# Patient Record
Sex: Female | Born: 1980 | Race: White | Hispanic: No | Marital: Single | Smoking: Current every day smoker
Health system: Southern US, Community
[De-identification: ages and names within clinical notes are randomized; demographics above are authoritative.]

## PROBLEM LIST (undated history)

## (undated) DIAGNOSIS — Q780 Osteogenesis imperfecta: Secondary | ICD-10-CM

---

## 2016-06-13 ENCOUNTER — Emergency Department (HOSPITAL_COMMUNITY)
Admission: EM | Admit: 2016-06-13 | Discharge: 2016-06-14 | Disposition: A | Discharge status: Home / Self Care | Payer: Medicaid - Out of State | Attending: Emergency Medicine | Admitting: Emergency Medicine

## 2016-06-13 ENCOUNTER — Encounter (HOSPITAL_COMMUNITY): Payer: Self-pay | Admitting: *Deleted

## 2016-06-13 DIAGNOSIS — S6991XA Unspecified injury of right wrist, hand and finger(s), initial encounter: Secondary | ICD-10-CM

## 2016-06-13 DIAGNOSIS — F17200 Nicotine dependence, unspecified, uncomplicated: Secondary | ICD-10-CM | POA: Diagnosis not present

## 2016-06-13 DIAGNOSIS — Z79899 Other long term (current) drug therapy: Secondary | ICD-10-CM | POA: Diagnosis not present

## 2016-06-13 DIAGNOSIS — S60211A Contusion of right wrist, initial encounter: Secondary | ICD-10-CM | POA: Diagnosis not present

## 2016-06-13 DIAGNOSIS — Y999 Unspecified external cause status: Secondary | ICD-10-CM | POA: Diagnosis not present

## 2016-06-13 DIAGNOSIS — M546 Pain in thoracic spine: Secondary | ICD-10-CM | POA: Diagnosis not present

## 2016-06-13 DIAGNOSIS — Y9289 Other specified places as the place of occurrence of the external cause: Secondary | ICD-10-CM | POA: Diagnosis not present

## 2016-06-13 DIAGNOSIS — W010XXA Fall on same level from slipping, tripping and stumbling without subsequent striking against object, initial encounter: Secondary | ICD-10-CM | POA: Diagnosis not present

## 2016-06-13 DIAGNOSIS — Y939 Activity, unspecified: Secondary | ICD-10-CM | POA: Diagnosis not present

## 2016-06-13 HISTORY — DX: Osteogenesis imperfecta: Q78.0

## 2016-06-13 MED ORDER — OXYCODONE-ACETAMINOPHEN 5-325 MG PO TABS
1.0000 | ORAL_TABLET | Freq: Once | ORAL | Status: AC
  Administered 2016-06-13: 1 via ORAL
  Filled 2016-06-13: qty 1

## 2016-06-13 NOTE — ED Notes (Signed)
[place] injured her rt wrist.  lmp just finished

## 2016-06-13 NOTE — ED Provider Notes (Signed)
CSN: [id number]     Arrival date & time 06/13/16  2314 History  By signing my name below, I, Christina Mahoney, attest that this documentation has been prepared under the direction and in the presence of Levorn Oleski, PA-C Electronically Signed: Soijett Mahoney, ED Scribe. 06/13/2016. 11:40 PM.    Chief Complaint  Patient presents with  . Wrist Injury      The history is provided by the patient. No language interpreter was used.    Christina ChurchesChristy Mahoney is a 35 y.o. female who presents to the Emergency Department complaining of right wrist injury occurring 2-3 hours ago. [place] she slipped on a wet rock, landing on her right wrist. Pt is having associated symptoms of bruising to right wrist, right wrist pain, and back pain. She notes that she has tried ice without medications for the relief of her symptoms. She denies wound, neuro deficits, right elbow pain, right shoulder pain, neck pain, head injury, LOC, or any other symptoms. Pt is allergic to morphine and naprosyn and her allergy is a rash with both medications. The incident with morphine was over 10 years ago.   Past Medical History  Diagnosis Date  . Osteogenesis imperfecta    History reviewed. No pertinent past surgical history. No family history on file. Social History  Substance Use Topics  . Smoking status: Current Every Day Smoker  . Smokeless tobacco: None  . Alcohol Use: No   OB History    No data available     Review of Systems  Musculoskeletal: Positive for back pain and arthralgias (right wrist). Negative for joint swelling.  Skin: Positive for color change (bruising to right wrist). Negative for wound.  Neurological: Negative for dizziness, light-headedness, numbness and headaches.  All other systems reviewed and are negative.     Allergies  Morphine and related and Naproxen  Home Medications   Prior to Admission medications   Medication Sig Start Date End Date Taking?  Authorizing Provider  lidocaine (LIDODERM) 5 % Place 1 patch onto the skin daily. Remove & Discard patch within 12 hours or as directed by MD 06/14/16   Anselm PancoastShawn C [place] Ganoe, PA-C  methocarbamol (ROBAXIN) 500 MG tablet Take 1 tablet (500 mg total) by mouth 2 (two) times daily. 06/14/16   Loveah Like C [place] Hollar, PA-C  oxyCODONE-acetaminophen (PERCOCET/ROXICET) 5-325 MG tablet Take 1 tablet by mouth every 6 (six) hours as needed for severe pain. 06/14/16   Ronnell Clinger C Philipe Laswell, PA-C   BP 119/82 mmHg  Pulse 109  Temp(Src) 98.3 F (36.8 C) (Oral)  Resp 16  Ht 5\' 2"  (1.575 m)  Wt 115 lb (52.164 kg)  BMI 21.03 kg/m2  SpO2 100% Physical Exam  Constitutional: She is oriented to person, place, and time. She appears well-developed and well-nourished. No distress.  HENT:  Head: Normocephalic and atraumatic.  Mouth/Throat: Oropharynx is clear and moist.  Eyes: Conjunctivae and EOM are normal. Pupils are equal, round, and reactive to light.  Neck: Normal range of motion. Neck supple.  Cardiovascular: Normal rate, regular rhythm and intact distal pulses.   Pulmonary/Chest: Effort normal and breath sounds normal. No respiratory distress. She exhibits no tenderness.  Abdominal: Soft. She exhibits no distension. There is no tenderness. There is no guarding.  Musculoskeletal: She exhibits edema and tenderness.  Bruising to the ulnar side of right wrist along with tenderness and swelling. Midline thoracic and lumbar spinal tenderness. No crepitus, deformity, or step-off.    Lymphadenopathy:  She has no cervical adenopathy.  Neurological: She is alert and oriented to person, place, and time. She has normal reflexes.  No sensory deficits. Strength 5/5 in all extremities, 5/5 grip strength bilaterally, but weaker on right due to pain. No gait disturbance. Coordination intact. Cranial nerves III-XII grossly intact.   Skin: Skin is warm and dry. She is not diaphoretic.  Psychiatric: She has a normal mood and affect. Her behavior is  normal.  Nursing note and vitals reviewed.   ED Course  Procedures (including critical care time) DIAGNOSTIC STUDIES: Oxygen Saturation is 100% on RA, nl by my interpretation.    COORDINATION OF CARE: 11:36 PM Discussed treatment plan with pt at bedside which includes right wrist xray, right hand xray, lumbar spine xray, thoracic spine xray, UA, and percocet, and pt agreed to plan.  Labs Review Labs Reviewed  POC URINE PREG, ED    Imaging Review Dg Thoracic Spine 2 View  06/14/2016  CLINICAL DATA:  Status post fall, with upper back pain. Initial encounter. EXAM: THORACIC SPINE 2 VIEWS COMPARISON:  None. FINDINGS: There is no evidence of fracture or subluxation. Vertebral bodies demonstrate normal height and alignment. Intervertebral disc spaces are preserved. The visualized portions of both lungs are clear. The mediastinum is unremarkable in appearance. IMPRESSION: No evidence of fracture or subluxation along the thoracic spine. Electronically Signed   By: Roanna Raider M.D.   On: 06/14/2016 01:10   Dg Lumbar Spine Complete  06/14/2016  CLINICAL DATA:  Status post fall, with lower back pain. Initial encounter. EXAM: LUMBAR SPINE - COMPLETE 4+ VIEW COMPARISON:  None. FINDINGS: There is no evidence of fracture or subluxation. Vertebral bodies demonstrate normal height and alignment. Intervertebral disc spaces are preserved. The visualized neural foramina are grossly unremarkable in appearance. The visualized bowel gas pattern is unremarkable in appearance; air and stool are noted within the colon. The sacroiliac joints are within normal limits. IMPRESSION: No evidence of fracture or subluxation along the lumbar spine. Electronically Signed   By: Roanna Raider M.D.   On: 06/14/2016 01:09   Dg Wrist Complete Right  06/14/2016  CLINICAL DATA:  Status post fall, with right wrist and hand pain and bruising. Initial encounter. EXAM: RIGHT WRIST - COMPLETE 3+ VIEW COMPARISON:  None. FINDINGS: There  is no evidence of fracture or dislocation. The carpal rows are intact, and demonstrate normal alignment. The joint spaces are preserved. No significant soft tissue abnormalities are seen. IMPRESSION: No evidence of fracture or dislocation. Electronically Signed   By: Roanna Raider M.D.   On: 06/14/2016 01:08   Dg Hand Complete Right  06/14/2016  CLINICAL DATA:  Status post fall, with right wrist and hand pain and bruising. Initial encounter. EXAM: RIGHT HAND - COMPLETE 3+ VIEW COMPARISON:  None. FINDINGS: There is no evidence of fracture or dislocation. The joint spaces are preserved. The carpal rows are intact, and demonstrate normal alignment. The soft tissues are unremarkable in appearance. IMPRESSION: No evidence of fracture or dislocation. Electronically Signed   By: Roanna Raider M.D.   On: 06/14/2016 01:09   I have personally reviewed and evaluated these images and lab results as part of my medical decision-making.  Orders Placed This Encounter  Procedures  . DG Wrist Complete Right  . DG Hand Complete Right  . DG Lumbar Spine Complete  . DG Thoracic Spine 2 View  . Re-check Vital Signs  . Splint wrist  . POC Urine Pregnancy, ED (do NOT order at [facility])  Medications  oxyCODONE-acetaminophen (PERCOCET/ROXICET) 5-325 MG per tablet 1 tablet (1 tablet Oral Given 06/13/16 2358)    MDM   Final diagnoses:  Wrist injury, right, initial encounter  Midline thoracic back pain    Neysa BonitoChristy Lapidus presents with right wrist and back pain following a fall earlier today.  Level 5 chart initiated due to the possibility of patient's wrist pain as a distracting injury combined with the mechanism. Patient also at higher risk for fracture due to osteogenesis imperfecta. Patient with no neuro or functional deficits. No red flag symptoms. No evidence of cauda equina or other serious issue. X-rays show no abnormalities. Patient to follow-up with orthopedics should symptoms fail to resolve. Home care and  return precautions discussed. Patient voiced understanding of these instructions and is comfortable with discharge.  Note: Although the patient's pulse was documented as elevated, upon my assessment patient's pulse was normal and remained so throughout her encounter in the ED. Suspect initial elevated pulse to be due to pain.  Filed Vitals:   06/13/16 2323  BP: 119/82  Pulse: 109  Temp: 98.3 F (36.8 C)  TempSrc: Oral  Resp: 16  Height: 5\' 2"  (1.575 m)  Weight: 52.164 kg  SpO2: 100%     I personally performed the services described in this documentation, which was scribed in my presence. The recorded information has been reviewed and is accurate.   Anselm PancoastShawn C Khole Arterburn, PA-C 06/14/16 0142  Dione Boozeavid Glick, MD 06/14/16 0700

## 2016-06-14 ENCOUNTER — Emergency Department (HOSPITAL_COMMUNITY): Payer: Medicaid - Out of State

## 2016-06-14 LAB — POC URINE PREG, ED: Preg Test, Ur: NEGATIVE

## 2016-06-14 MED ORDER — LIDOCAINE 5 % EX PTCH: 1.0000 | MEDICATED_PATCH | CUTANEOUS | Status: AC

## 2016-06-14 MED ORDER — METHOCARBAMOL 500 MG PO TABS: 500.0000 mg | ORAL_TABLET | Freq: Two times a day (BID) | ORAL | Status: AC

## 2016-06-14 MED ORDER — OXYCODONE-ACETAMINOPHEN 5-325 MG PO TABS: 1.0000 | ORAL_TABLET | Freq: Four times a day (QID) | ORAL | Status: AC | PRN

## 2016-06-14 NOTE — Discharge Instructions (Signed)
You have been seen today for evaluation following a fall. Your imaging showed no abnormalities. Expect your soreness to increase over the next 2-3 days. Take it easy, but do not lay around too much as this may make the stiffness worse. May take plain Tylenol for pain. Robaxin is a muscle relaxer and may help loosen stiff muscles. Percocet for severe pain. Do not take the Robaxin or Percocet while driving or performing other dangerous activities. Follow-up with orthopedics should symptoms continue.

## 2016-11-13 IMAGING — CR DG LUMBAR SPINE COMPLETE 4+V
5 series · 5 of 5 positions shown · non-contrast
Comparison: None.

CLINICAL DATA: Status post fall, with lower back pain. Initial
encounter.

EXAM:
LUMBAR SPINE - COMPLETE 4+ VIEW

[l-spine ap]
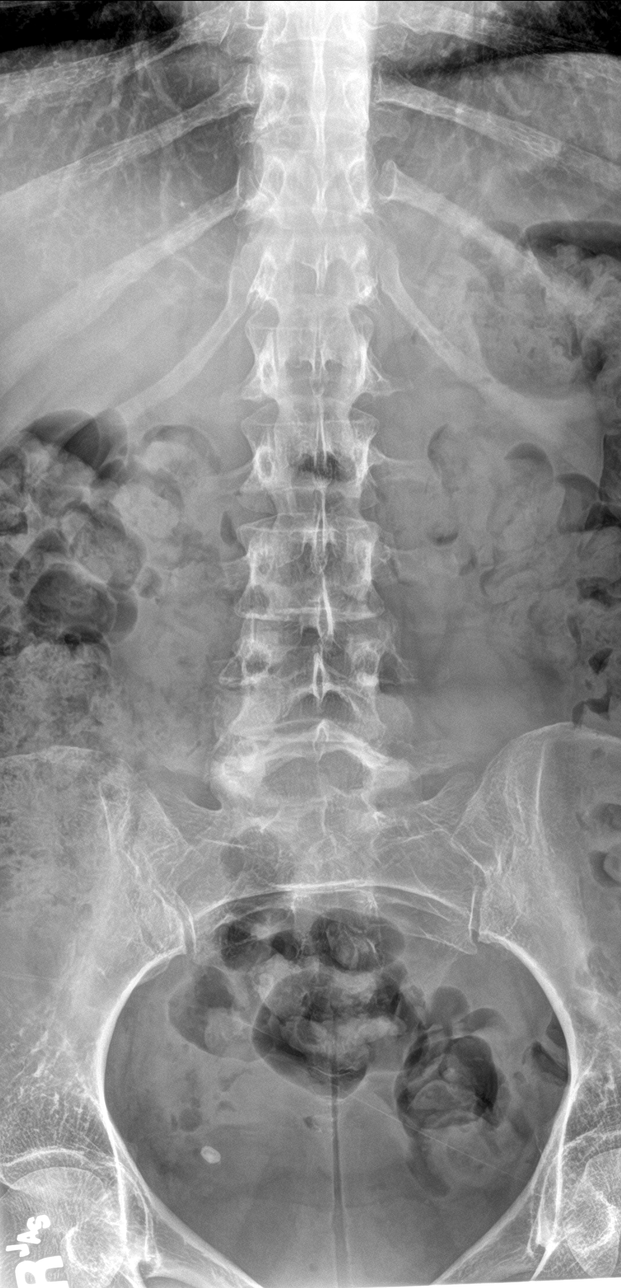

[l-spine obl (1 of 2)]
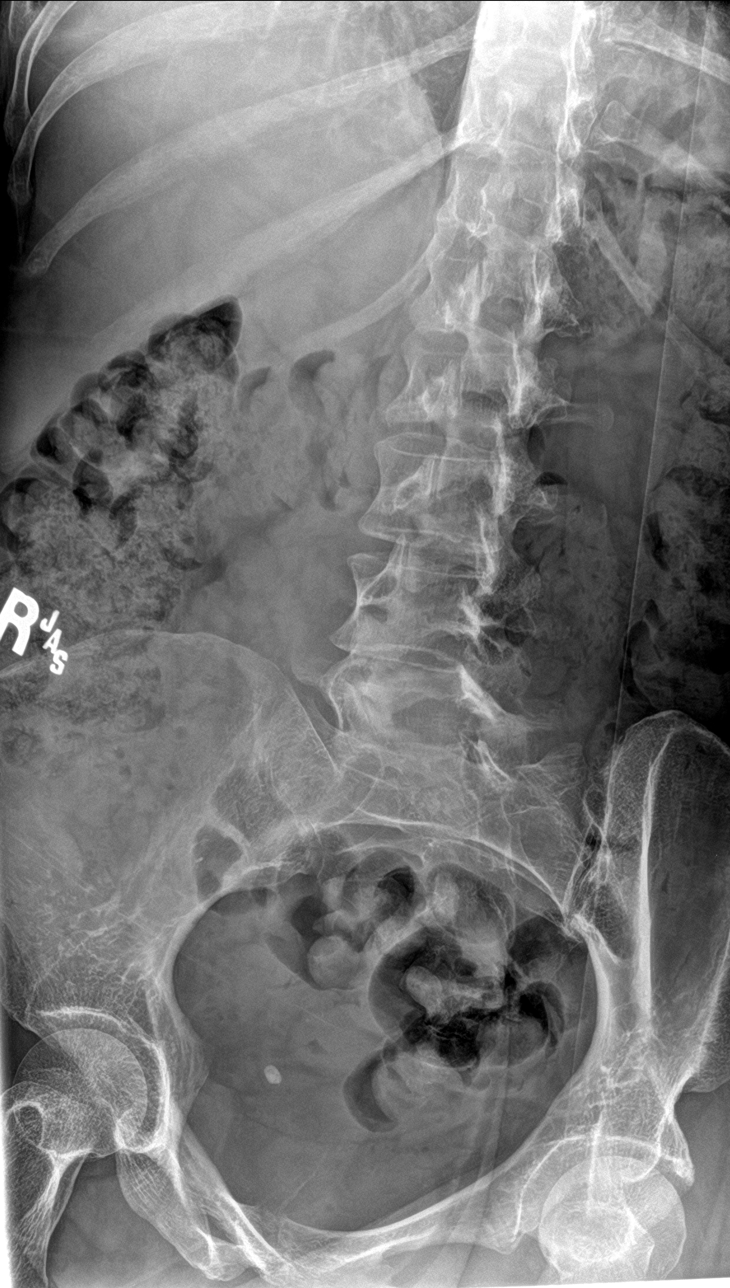

[l-spine lat]
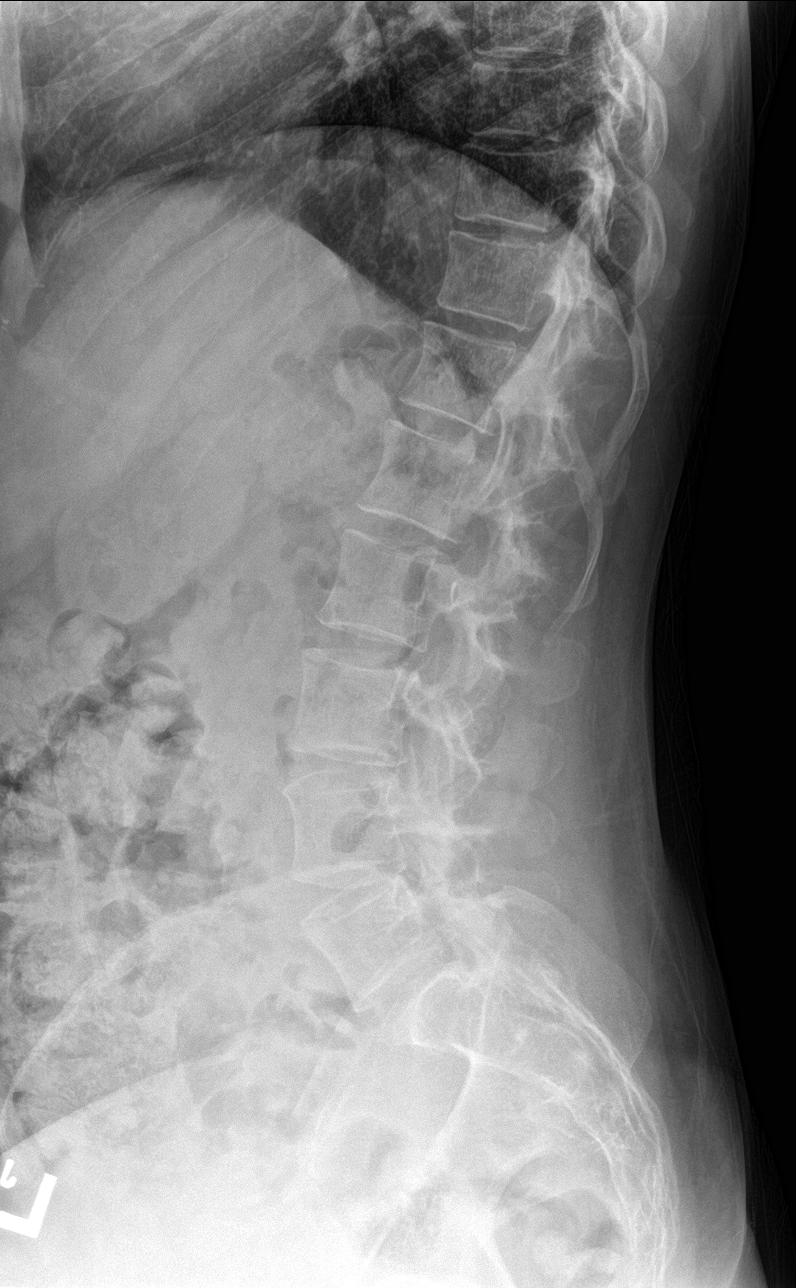

[l-spine spot]
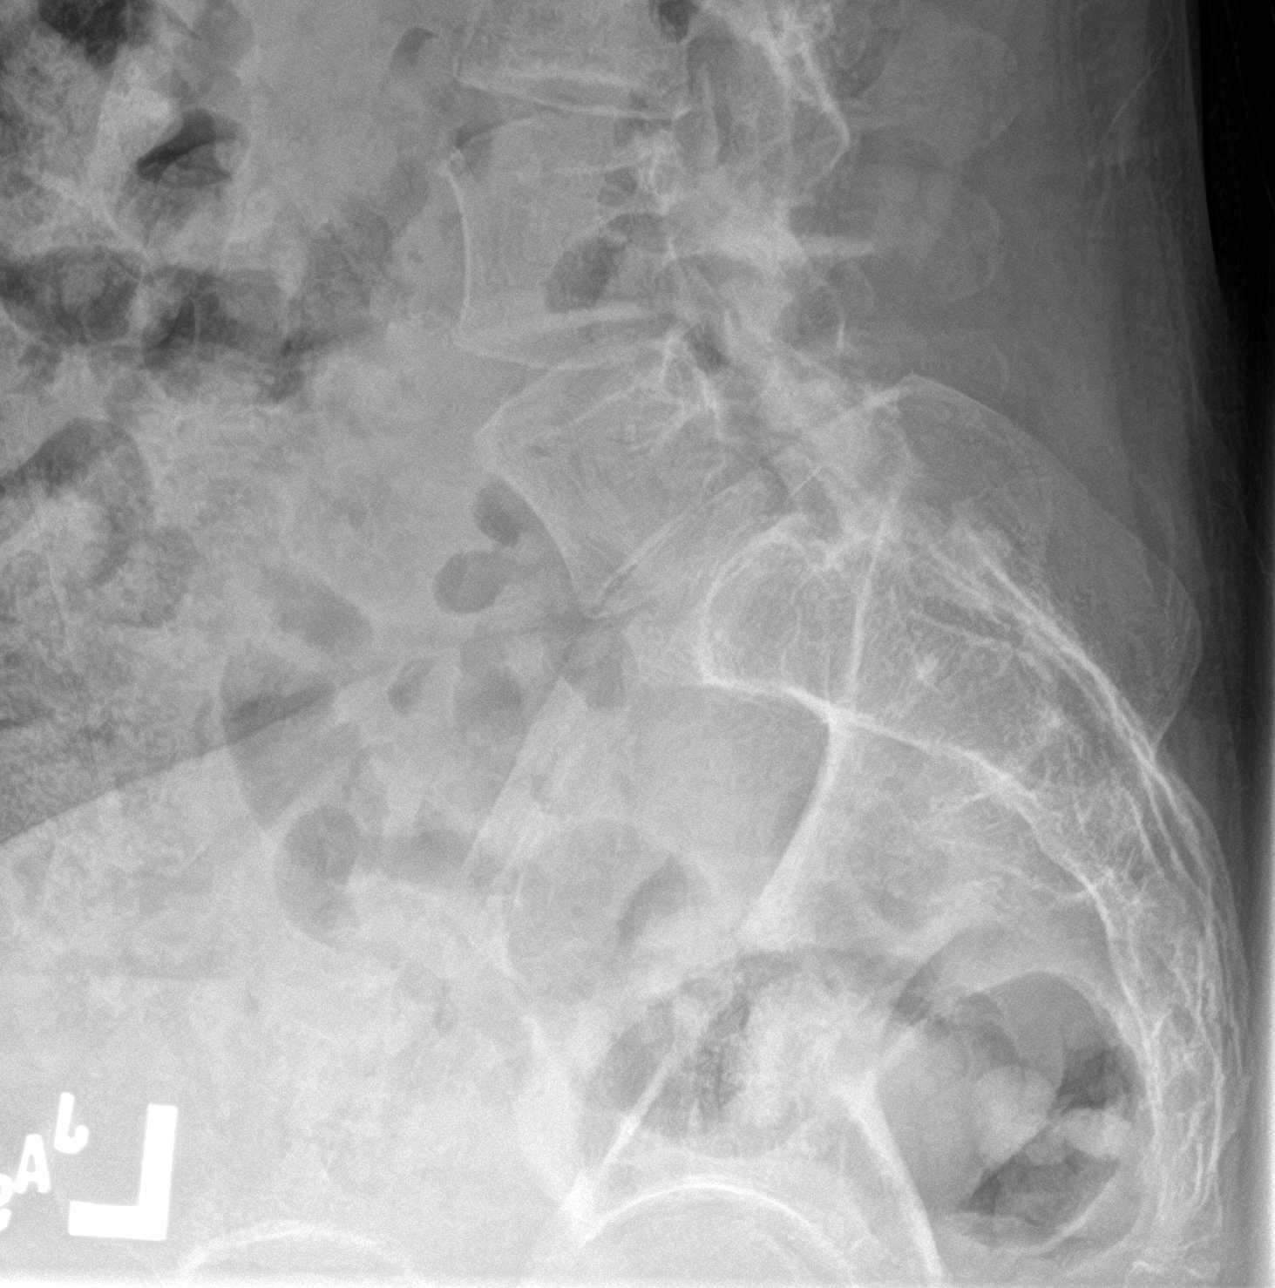

[l-spine obl (2 of 2)]
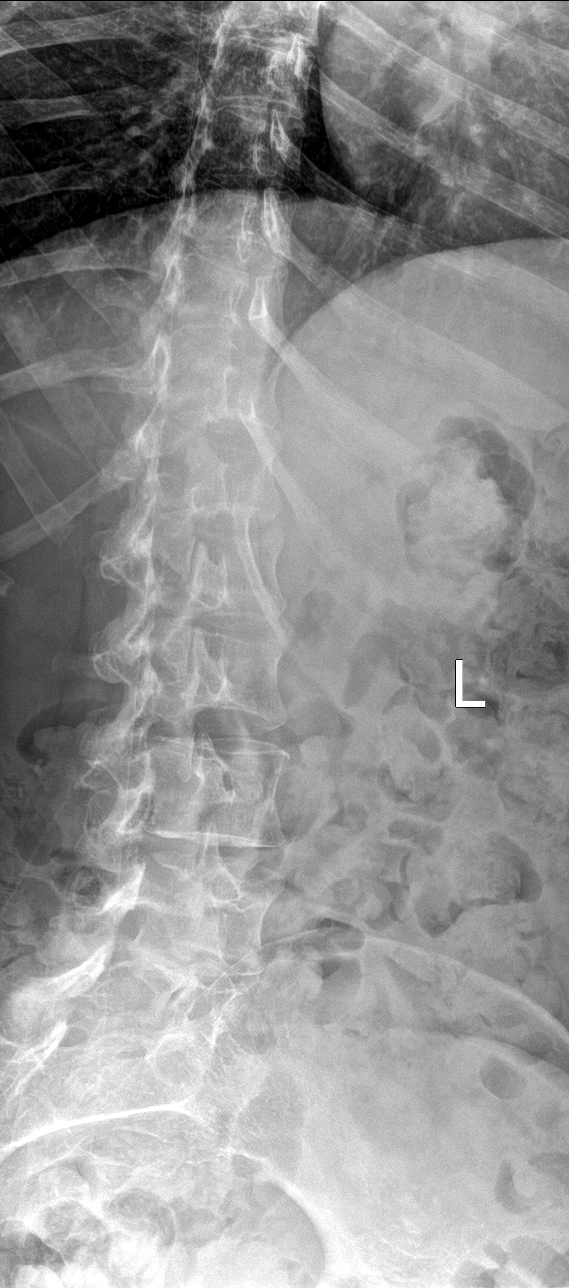

[5 of 5 positions shown; findings below may reference images not displayed]

FINDINGS: There is no evidence of fracture or subluxation. Vertebral bodies
demonstrate normal height and alignment. Intervertebral disc spaces
are preserved. The visualized neural foramina are grossly
unremarkable in appearance.

The visualized bowel gas pattern is unremarkable in appearance; air
and stool are noted within the colon. The sacroiliac joints are
within normal limits.
IMPRESSION: No evidence of fracture or subluxation along the lumbar spine.

## 2016-11-13 IMAGING — CR DG WRIST COMPLETE 3+V*R*
4 series · 4 of 4 positions shown · non-contrast
Comparison: None.

CLINICAL DATA: Status post fall, with right wrist and hand pain and
bruising. Initial encounter.

EXAM:
RIGHT WRIST - COMPLETE 3+ VIEW

[wrist pa]
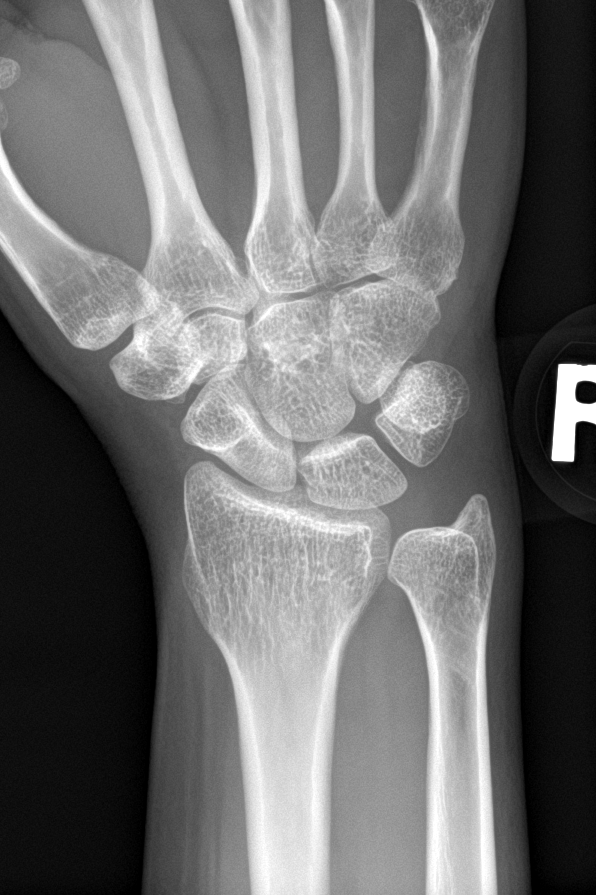

[wrist obl]
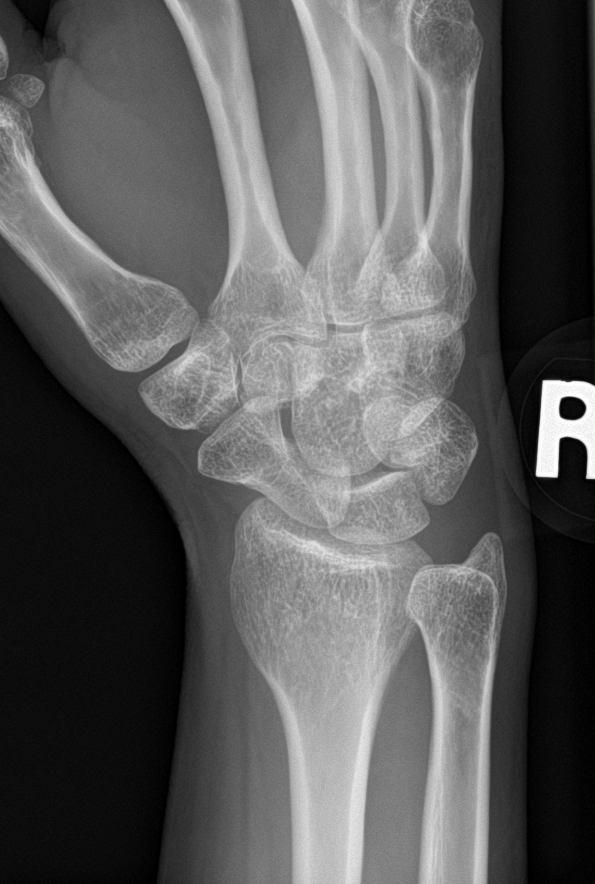

[wrist lat]
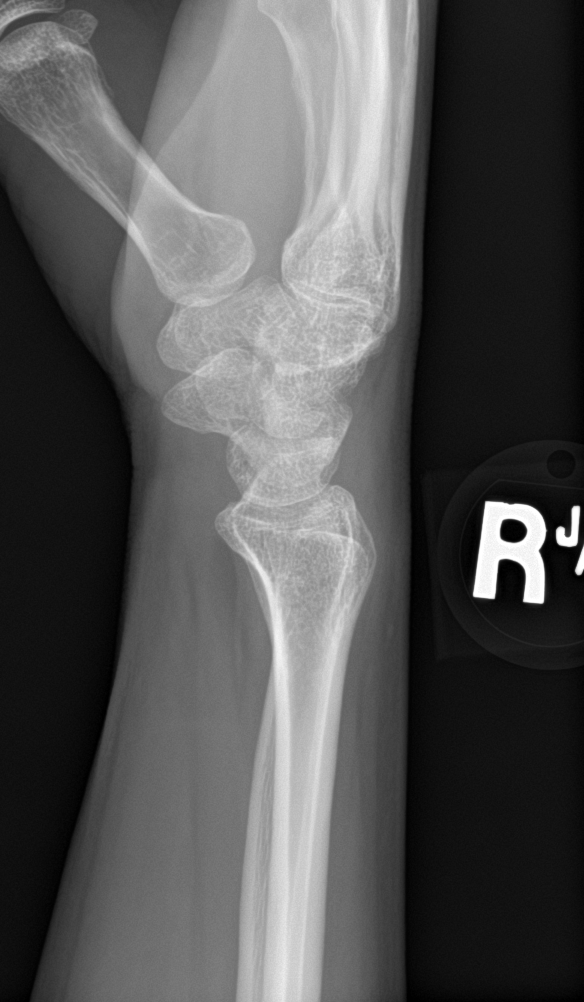

[wrist navicular]
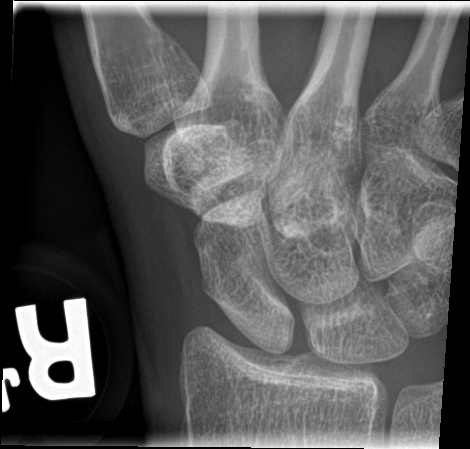

[4 of 4 positions shown; findings below may reference images not displayed]

FINDINGS: There is no evidence of fracture or dislocation. The carpal rows are
intact, and demonstrate normal alignment. The joint spaces are
preserved.

No significant soft tissue abnormalities are seen.
IMPRESSION: No evidence of fracture or dislocation.

## 2019-11-01 ENCOUNTER — Other Ambulatory Visit: Payer: Self-pay

## 2019-11-01 DIAGNOSIS — Z20822 Contact with and (suspected) exposure to covid-19: Secondary | ICD-10-CM

## 2019-11-03 LAB — NOVEL CORONAVIRUS, NAA: SARS-CoV-2, NAA: NOT DETECTED
